# Patient Record
Sex: Female | Born: 1960 | Race: White | Hispanic: No
Health system: Midwestern US, Academic
[De-identification: ages and names within clinical notes are randomized; demographics above are authoritative.]

---

## 2017-12-03 ENCOUNTER — Encounter: Admit: 2017-12-03 | Discharge: 2017-12-03 | Payer: MEDICARE

## 2017-12-03 ENCOUNTER — Encounter: Admit: 2017-12-03 | Discharge: 2017-12-04 | Payer: MEDICARE

## 2017-12-03 DIAGNOSIS — D6861 Antiphospholipid syndrome: ICD-10-CM

## 2017-12-03 DIAGNOSIS — S065X9A Traumatic subdural hemorrhage with loss of consciousness of unspecified duration, initial encounter: ICD-10-CM

## 2017-12-03 DIAGNOSIS — M8000XA Age-related osteoporosis with current pathological fracture, unspecified site, initial encounter for fracture: ICD-10-CM

## 2017-12-03 MED ORDER — ALBUTEROL SULFATE 2.5 MG/0.5 ML IN NEBU
2.5 mg | Freq: Four times a day (QID) | RESPIRATORY_TRACT | 0 refills | Status: DC | PRN
Start: 2017-12-03 — End: 2017-12-05
  Administered 2017-12-04 – 2017-12-05 (×7): 2.5 mg via RESPIRATORY_TRACT

## 2017-12-03 MED ORDER — LEVETIRACETAM IN NACL (ISO-OS) 500 MG/100 ML IV PGBK
500 mg | Freq: Two times a day (BID) | INTRAVENOUS | 0 refills | Status: DC
Start: 2017-12-03 — End: 2017-12-04
  Administered 2017-12-04: 04:00:00 500 mg via INTRAVENOUS

## 2017-12-03 MED ORDER — BUDESONIDE-FORMOTEROL 160-4.5 MCG/ACTUATION IN HFAA
2 | Freq: Two times a day (BID) | RESPIRATORY_TRACT | 0 refills | Status: DC
Start: 2017-12-03 — End: 2017-12-04
  Administered 2017-12-04: 15:00:00 1 via RESPIRATORY_TRACT

## 2017-12-03 MED ORDER — LABETALOL 5 MG/ML IV SYRG
10 mg | INTRAVENOUS | 0 refills | Status: DC | PRN
Start: 2017-12-03 — End: 2017-12-04

## 2017-12-03 MED ORDER — SODIUM CHLORIDE 0.9 % IJ SOLN
50 mL | Freq: Once | INTRAVENOUS | 0 refills | Status: CP
Start: 2017-12-03 — End: ?
  Administered 2017-12-04: 04:00:00 50 mL via INTRAVENOUS

## 2017-12-03 MED ORDER — FENTANYL CITRATE (PF) 50 MCG/ML IJ SOLN
25-50 ug | INTRAVENOUS | 0 refills | Status: DC | PRN
Start: 2017-12-03 — End: 2017-12-04
  Administered 2017-12-04: 04:00:00 25 ug via INTRAVENOUS
  Administered 2017-12-04 (×2): 50 ug via INTRAVENOUS

## 2017-12-03 MED ORDER — IOHEXOL 350 MG IODINE/ML IV SOLN
80 mL | Freq: Once | INTRAVENOUS | 0 refills | Status: CP
Start: 2017-12-03 — End: ?
  Administered 2017-12-04: 04:00:00 80 mL via INTRAVENOUS

## 2017-12-03 MED ORDER — ONDANSETRON HCL (PF) 4 MG/2 ML IJ SOLN
4 mg | INTRAVENOUS | 0 refills | Status: DC | PRN
Start: 2017-12-03 — End: 2017-12-11

## 2017-12-03 MED ORDER — SODIUM CHLORIDE 0.9 % IV SOLP
1000 mL | INTRAVENOUS | 0 refills | Status: CP
Start: 2017-12-03 — End: ?
  Administered 2017-12-04: 04:00:00 1000 mL via INTRAVENOUS

## 2017-12-03 MED ORDER — IPRATROPIUM BROMIDE 0.02 % IN SOLN
0.5 mg | Freq: Four times a day (QID) | RESPIRATORY_TRACT | 0 refills | Status: DC | PRN
Start: 2017-12-03 — End: 2017-12-05
  Administered 2017-12-04 – 2017-12-05 (×7): 0.5 mg via RESPIRATORY_TRACT

## 2017-12-04 ENCOUNTER — Encounter: Admit: 2017-12-04 | Discharge: 2017-12-04 | Payer: MEDICARE

## 2017-12-04 ENCOUNTER — Inpatient Hospital Stay: Admit: 2017-12-04 | Discharge: 2017-12-04 | Payer: MEDICARE

## 2017-12-04 ENCOUNTER — Ambulatory Visit: Admit: 2017-12-04 | Discharge: 2017-12-04 | Payer: MEDICARE

## 2017-12-04 DIAGNOSIS — F419 Anxiety disorder, unspecified: ICD-10-CM

## 2017-12-04 DIAGNOSIS — D6859 Other primary thrombophilia: Principal | ICD-10-CM

## 2017-12-04 DIAGNOSIS — J449 Chronic obstructive pulmonary disease, unspecified: ICD-10-CM

## 2017-12-04 DIAGNOSIS — M8580 Other specified disorders of bone density and structure, unspecified site: ICD-10-CM

## 2017-12-04 DIAGNOSIS — E785 Hyperlipidemia, unspecified: ICD-10-CM

## 2017-12-04 DIAGNOSIS — S065X0A Traumatic subdural hemorrhage without loss of consciousness, initial encounter: Principal | ICD-10-CM

## 2017-12-04 DIAGNOSIS — M329 Systemic lupus erythematosus, unspecified: ICD-10-CM

## 2017-12-04 DIAGNOSIS — M797 Fibromyalgia: ICD-10-CM

## 2017-12-04 DIAGNOSIS — F329 Major depressive disorder, single episode, unspecified: ICD-10-CM

## 2017-12-04 DIAGNOSIS — D6861 Antiphospholipid syndrome: ICD-10-CM

## 2017-12-04 DIAGNOSIS — S22000A Wedge compression fracture of unspecified thoracic vertebra, initial encounter for closed fracture: ICD-10-CM

## 2017-12-04 LAB — BASIC METABOLIC PANEL
Lab: 121 MMOL/L — ABNORMAL LOW (ref 137–147)
Lab: 126 MMOL/L — ABNORMAL LOW (ref <150)

## 2017-12-04 LAB — CBC
Lab: 10 FL (ref >60)
Lab: 14 % — ABNORMAL LOW (ref 11–15)
Lab: 175 K/UL (ref >60)
Lab: 34 g/dL — ABNORMAL LOW (ref 32.0–36.0)
Lab: 5 K/UL (ref 4.5–11.0)
Lab: 3 K/UL — ABNORMAL LOW (ref <100)

## 2017-12-04 LAB — MANUAL DIFF
Lab: 12 % — ABNORMAL HIGH (ref 0–10)
Lab: 25 % (ref 24–44)
Lab: 5 % (ref 4–12)
Lab: 58 % (ref 41–77)

## 2017-12-04 LAB — LACTIC ACID(LACTATE): Lab: 0.4 MMOL/L — ABNORMAL LOW (ref 0.5–2.0)

## 2017-12-04 LAB — PHOSPHORUS
Lab: 3 mg/dL — ABNORMAL LOW (ref 2.0–4.5)
Lab: 3.3 mg/dL — ABNORMAL LOW (ref >60)

## 2017-12-04 LAB — MAGNESIUM
Lab: 1.7 mg/dL — ABNORMAL LOW (ref 1.6–2.6)
Lab: 1.8 mg/dL — ABNORMAL LOW (ref 1.6–2.6)

## 2017-12-04 LAB — PROTIME INR (PT)
Lab: 1.5 pg — ABNORMAL HIGH (ref 0.8–1.2)
Lab: 1.4 FL — ABNORMAL HIGH (ref >60)

## 2017-12-04 LAB — PARATHYROID HORMONE: Lab: 22 pg/mL (ref 10–65)

## 2017-12-04 LAB — IONIZED CALCIUM: Lab: 1 MMOL/L (ref 1.0–1.3)

## 2017-12-04 LAB — 25-OH VITAMIN D (D2 + D3): Lab: 28 ng/mL — ABNORMAL LOW (ref 30–80)

## 2017-12-04 LAB — TSH WITH FREE T4 REFLEX: Lab: 0.6 uU/mL (ref 0.35–5.00)

## 2017-12-04 MED ORDER — DOXYCYCLINE HYCLATE 100 MG PO TAB
100 mg | Freq: Two times a day (BID) | ORAL | 0 refills | Status: DC
Start: 2017-12-04 — End: 2017-12-04
  Administered 2017-12-04 (×2): 100 mg via ORAL

## 2017-12-04 MED ORDER — FLUOXETINE 20 MG PO CAP
40 mg | Freq: Every day | ORAL | 0 refills | Status: DC
Start: 2017-12-04 — End: 2017-12-11
  Administered 2017-12-04 – 2017-12-10 (×7): 40 mg via ORAL

## 2017-12-04 MED ORDER — DIAZEPAM 10 MG PO TAB
10 mg | Freq: Three times a day (TID) | ORAL | 0 refills | Status: DC | PRN
Start: 2017-12-04 — End: 2017-12-11
  Administered 2017-12-04 – 2017-12-08 (×7): 10 mg via ORAL

## 2017-12-04 MED ORDER — HYDROXYCHLOROQUINE 200 MG PO TAB
100 mg | Freq: Every day | ORAL | 0 refills | Status: DC
Start: 2017-12-04 — End: 2017-12-11
  Administered 2017-12-05 – 2017-12-10 (×6): 100 mg via ORAL

## 2017-12-04 MED ORDER — SENNOSIDES 8.6 MG PO TAB
1 | Freq: Two times a day (BID) | ORAL | 0 refills | Status: DC
Start: 2017-12-04 — End: 2017-12-11
  Administered 2017-12-04 – 2017-12-10 (×8): 1 via ORAL

## 2017-12-04 MED ORDER — ATORVASTATIN 20 MG PO TAB
20 mg | Freq: Every day | ORAL | 0 refills | Status: DC
Start: 2017-12-04 — End: 2017-12-11
  Administered 2017-12-04 – 2017-12-10 (×7): 20 mg via ORAL

## 2017-12-04 MED ORDER — HYDROXYCHLOROQUINE 200 MG PO TAB
200 mg | Freq: Every day | ORAL | 0 refills | Status: DC
Start: 2017-12-04 — End: 2017-12-04

## 2017-12-04 MED ORDER — PANTOPRAZOLE(#) 2MG/ML PO SUSP
40 mg | Freq: Every day | ORAL | 0 refills | Status: DC
Start: 2017-12-04 — End: 2017-12-05
  Administered 2017-12-05: 03:00:00 40 mg via ORAL

## 2017-12-04 MED ORDER — LEVETIRACETAM 500 MG PO TAB
500 mg | Freq: Two times a day (BID) | ORAL | 0 refills | Status: DC
Start: 2017-12-04 — End: 2017-12-11
  Administered 2017-12-04 – 2017-12-10 (×13): 500 mg via ORAL

## 2017-12-04 MED ORDER — PANTOPRAZOLE 40 MG PO TBEC
40 mg | Freq: Every day | ORAL | 0 refills | Status: DC
Start: 2017-12-04 — End: 2017-12-11
  Administered 2017-12-06 – 2017-12-10 (×5): 40 mg via ORAL

## 2017-12-04 MED ORDER — BUDESONIDE-FORMOTEROL 160-4.5 MCG/ACTUATION IN HFAA
1 | Freq: Two times a day (BID) | RESPIRATORY_TRACT | 0 refills | Status: DC
Start: 2017-12-04 — End: 2017-12-11
  Administered 2017-12-07: 04:00:00 1 via RESPIRATORY_TRACT

## 2017-12-04 MED ORDER — OXYCODONE 5 MG PO TAB
5-15 mg | ORAL | 0 refills | Status: DC | PRN
Start: 2017-12-04 — End: 2017-12-04

## 2017-12-04 MED ORDER — HYDROMORPHONE (PF) 2 MG/ML IJ SYRG
.5-1 mg | INTRAVENOUS | 0 refills | Status: DC | PRN
Start: 2017-12-04 — End: 2017-12-04
  Administered 2017-12-04 (×4): 0.5 mg via INTRAVENOUS

## 2017-12-04 MED ORDER — HYDROXYCHLOROQUINE 200 MG PO TAB
200 mg | Freq: Every day | ORAL | 0 refills | Status: DC
Start: 2017-12-04 — End: 2017-12-11
  Administered 2017-12-04 – 2017-12-09 (×6): 200 mg via ORAL

## 2017-12-04 MED ORDER — CALCIUM CARBONATE-VITAMIN D3 500 MG(1,250MG) -200 UNIT PO TAB
1 | Freq: Two times a day (BID) | ORAL | 0 refills | Status: DC
Start: 2017-12-04 — End: 2017-12-05
  Administered 2017-12-04 – 2017-12-05 (×3): 1 via ORAL

## 2017-12-04 MED ORDER — MAGNESIUM SULFATE IN D5W 1 GRAM/100 ML IV PGBK
1 g | INTRAVENOUS | 0 refills | Status: CP
Start: 2017-12-04 — End: ?
  Administered 2017-12-04 (×2): 1 g via INTRAVENOUS

## 2017-12-04 MED ORDER — MIRTAZAPINE 7.5 MG PO TAB
7.5 mg | Freq: Every evening | ORAL | 0 refills | Status: DC
Start: 2017-12-04 — End: 2017-12-05
  Administered 2017-12-05: 03:00:00 7.5 mg via ORAL

## 2017-12-04 MED ORDER — METOCLOPRAMIDE HCL 10 MG PO TAB
5 mg | Freq: Before meals | ORAL | 0 refills | Status: DC
Start: 2017-12-04 — End: 2017-12-11
  Administered 2017-12-04 – 2017-12-10 (×23): 5 mg via ORAL

## 2017-12-04 MED ORDER — OXYCODONE 5 MG PO TAB
5-10 mg | ORAL | 0 refills | Status: DC | PRN
Start: 2017-12-04 — End: 2017-12-11
  Administered 2017-12-04 – 2017-12-06 (×12): 10 mg via ORAL
  Administered 2017-12-07: 23:00:00 5 mg via ORAL
  Administered 2017-12-07 – 2017-12-10 (×12): 10 mg via ORAL

## 2017-12-04 MED ORDER — HYDROXYCHLOROQUINE 200 MG PO TAB
200 mg | Freq: Every day | ORAL | 0 refills | Status: DC
Start: 2017-12-04 — End: 2017-12-04
  Administered 2017-12-04: 19:00:00 200 mg via ORAL

## 2017-12-04 MED ORDER — ALPRAZOLAM 1 MG PO TAB
1 mg | Freq: Every evening | ORAL | 0 refills | Status: DC | PRN
Start: 2017-12-04 — End: 2017-12-11
  Administered 2017-12-05 – 2017-12-09 (×5): 1 mg via ORAL

## 2017-12-04 MED ORDER — GABAPENTIN 400 MG PO CAP
400 mg | ORAL | 0 refills | Status: DC
Start: 2017-12-04 — End: 2017-12-04
  Administered 2017-12-04: 19:00:00 400 mg via ORAL

## 2017-12-04 MED ORDER — ACETAMINOPHEN 325 MG PO TAB
650 mg | ORAL | 0 refills | Status: DC | PRN
Start: 2017-12-04 — End: 2017-12-11
  Administered 2017-12-04 – 2017-12-10 (×12): 650 mg via ORAL

## 2017-12-04 MED ORDER — CYCLOBENZAPRINE 10 MG PO TAB
10 mg | Freq: Three times a day (TID) | ORAL | 0 refills | Status: DC
Start: 2017-12-04 — End: 2017-12-11
  Administered 2017-12-04 – 2017-12-10 (×17): 10 mg via ORAL

## 2017-12-05 LAB — ALK PHOS TOTAL: Lab: 124 U/L — ABNORMAL HIGH (ref 25–110)

## 2017-12-05 LAB — SODIUM-URINE RANDOM: Lab: 51 MMOL/L

## 2017-12-05 LAB — ALBUMIN: Lab: 3.6 g/dL (ref 3.5–5.0)

## 2017-12-05 LAB — CELIAC SCREEN

## 2017-12-05 LAB — BASIC METABOLIC PANEL: Lab: 129 MMOL/L — ABNORMAL LOW (ref 137–147)

## 2017-12-05 LAB — PHOSPHORUS: Lab: 3.9 mg/dL — ABNORMAL LOW (ref >60)

## 2017-12-05 LAB — CBC: Lab: 3.7 K/UL — ABNORMAL LOW (ref >60)

## 2017-12-05 LAB — BETA HYDROXYBUTYRATE (KETONES): Lab: 3.8 MMOL/L — ABNORMAL HIGH (ref <0.3)

## 2017-12-05 LAB — MAGNESIUM: Lab: 1.9 mg/dL — ABNORMAL LOW (ref >60)

## 2017-12-05 LAB — PROTIME INR (PT): Lab: 1.6 MMOL/L — ABNORMAL HIGH (ref 0.8–1.2)

## 2017-12-05 LAB — CORTISOL-AM: Lab: 24 ug/dL — ABNORMAL HIGH (ref 6.7–22.6)

## 2017-12-05 LAB — OSMOLALITY-URINE RANDOM: Lab: 323 mosm/kg (ref 50–1400)

## 2017-12-05 MED ORDER — ALBUTEROL SULFATE 2.5 MG/0.5 ML IN NEBU
2.5 mg | Freq: Every morning | RESPIRATORY_TRACT | 0 refills | Status: DC
Start: 2017-12-05 — End: 2017-12-11
  Administered 2017-12-07 – 2017-12-10 (×5): 2.5 mg via RESPIRATORY_TRACT

## 2017-12-05 MED ORDER — ALBUTEROL SULFATE 90 MCG/ACTUATION IN HFAA
2 | Freq: Three times a day (TID) | RESPIRATORY_TRACT | 0 refills | Status: DC | PRN
Start: 2017-12-05 — End: 2017-12-11
  Administered 2017-12-06 – 2017-12-07 (×3): 2 via RESPIRATORY_TRACT

## 2017-12-05 MED ORDER — HEPARIN, PORCINE (PF) 5,000 UNIT/0.5 ML IJ SYRG
5000 [IU] | SUBCUTANEOUS | 0 refills | Status: DC
Start: 2017-12-05 — End: 2017-12-06
  Administered 2017-12-06 (×2): 5000 [IU] via SUBCUTANEOUS

## 2017-12-05 MED ORDER — MAGNESIUM OXIDE 400 MG (241.3 MG MAGNESIUM) PO TAB
400 mg | Freq: Once | ORAL | 0 refills | Status: CP
Start: 2017-12-05 — End: ?
  Administered 2017-12-05: 14:00:00 400 mg via ORAL

## 2017-12-05 MED ORDER — IPRATROPIUM BROMIDE 0.02 % IN SOLN
0.5 mg | Freq: Every morning | RESPIRATORY_TRACT | 0 refills | Status: DC
Start: 2017-12-05 — End: 2017-12-11
  Administered 2017-12-06 – 2017-12-10 (×5): 0.5 mg via RESPIRATORY_TRACT

## 2017-12-05 MED ORDER — MIRTAZAPINE 15 MG PO TAB
15 mg | Freq: Every evening | ORAL | 0 refills | Status: DC
Start: 2017-12-05 — End: 2017-12-11
  Administered 2017-12-06 – 2017-12-10 (×5): 15 mg via ORAL

## 2017-12-05 MED ORDER — POTASSIUM CHLORIDE 20 MEQ PO TBTQ
20 meq | Freq: Once | ORAL | 0 refills | Status: CP
Start: 2017-12-05 — End: ?
  Administered 2017-12-05: 14:00:00 20 meq via ORAL

## 2017-12-05 MED ORDER — CHOLECALCIFEROL (VITAMIN D3) 1,000 UNIT PO TAB
2000 [IU] | Freq: Every day | ORAL | 0 refills | Status: DC
Start: 2017-12-05 — End: 2017-12-11
  Administered 2017-12-05 – 2017-12-10 (×5): 2000 [IU] via ORAL

## 2017-12-05 MED ORDER — CALCIUM CITRATE 200 MG (950 MG) PO TAB
1900 mg | Freq: Three times a day (TID) | ORAL | 0 refills | Status: DC
Start: 2017-12-05 — End: 2017-12-11
  Administered 2017-12-05 – 2017-12-10 (×14): 1900 mg via ORAL

## 2017-12-05 MED ORDER — GABAPENTIN 400 MG PO CAP
400 mg | Freq: Two times a day (BID) | ORAL | 0 refills | Status: DC
Start: 2017-12-05 — End: 2017-12-11
  Administered 2017-12-05 – 2017-12-10 (×7): 400 mg via ORAL

## 2017-12-06 ENCOUNTER — Ambulatory Visit: Admit: 2017-12-06 | Discharge: 2017-12-07 | Payer: MEDICARE

## 2017-12-06 DIAGNOSIS — Z5181 Encounter for therapeutic drug level monitoring: ICD-10-CM

## 2017-12-06 DIAGNOSIS — M810 Age-related osteoporosis without current pathological fracture: Principal | ICD-10-CM

## 2017-12-06 LAB — URINE COLLECTION
Lab: 179 mL
Lab: 179 mL — ABNORMAL HIGH (ref 11–15)
Lab: 24 g/dL (ref 32.0–36.0)
Lab: 24

## 2017-12-06 LAB — BASIC METABOLIC PANEL: Lab: 131 MMOL/L — ABNORMAL LOW (ref >60)

## 2017-12-06 LAB — PHOSPHORUS: Lab: 3 mg/dL — ABNORMAL LOW (ref >60)

## 2017-12-06 LAB — CBC: Lab: 3.4 K/UL — ABNORMAL LOW (ref 4.5–11.0)

## 2017-12-06 LAB — CALCIUM-URINE 24HR: Lab: 5.9 mg/dL (ref 40–220)

## 2017-12-06 LAB — PROTIME INR (PT): Lab: 1.4 MMOL/L — ABNORMAL HIGH (ref 0.8–1.2)

## 2017-12-06 LAB — SODIUM-URINE 24 HR: Lab: 27 MMOL/L

## 2017-12-06 LAB — MAGNESIUM: Lab: 1.9 mg/dL — ABNORMAL HIGH (ref 1.6–2.6)

## 2017-12-06 LAB — CREATININE-URINE 24 HR: Lab: 22 mg/dL (ref 100–300)

## 2017-12-06 MED ORDER — POTASSIUM CHLORIDE 20 MEQ PO TBTQ
20 meq | Freq: Once | ORAL | 0 refills | Status: CP
Start: 2017-12-06 — End: ?
  Administered 2017-12-06: 12:00:00 20 meq via ORAL

## 2017-12-06 MED ORDER — WARFARIN 5 MG PO TAB
5 mg | ORAL | 0 refills | Status: DC
Start: 2017-12-06 — End: 2017-12-06

## 2017-12-06 MED ORDER — HEPARIN, PORCINE (PF) 5,000 UNIT/0.5 ML IJ SYRG
5000 [IU] | SUBCUTANEOUS | 0 refills | Status: DC
Start: 2017-12-06 — End: 2017-12-11
  Administered 2017-12-06 – 2017-12-10 (×10): 5000 [IU] via SUBCUTANEOUS

## 2017-12-06 MED ORDER — WARFARIN PHARMACY TO MANAGE
1 | 0 refills | Status: DC
Start: 2017-12-06 — End: 2017-12-06

## 2017-12-06 MED ORDER — WARFARIN 7.5 MG PO TAB
7.5 mg | ORAL | 0 refills | Status: DC
Start: 2017-12-06 — End: 2017-12-06

## 2017-12-06 MED ORDER — ENOXAPARIN 60 MG/0.6 ML SC SYRG
1 mg/kg | Freq: Two times a day (BID) | SUBCUTANEOUS | 0 refills | Status: DC
Start: 2017-12-06 — End: 2017-12-06

## 2017-12-07 LAB — MANUAL DIFF
Lab: 1 % (ref 0–10)
Lab: 14 % — ABNORMAL LOW (ref 24–44)
Lab: 7 % (ref 4–12)
Lab: 78 % — ABNORMAL HIGH (ref 41–77)

## 2017-12-07 LAB — CBC
Lab: 10 FL (ref >60)
Lab: 14 % (ref 11–15)
Lab: 212 K/UL (ref >60)
Lab: 28 pg — ABNORMAL LOW (ref <=2)
Lab: 3.1 K/UL — ABNORMAL LOW (ref 4.5–11.0)
Lab: 33 g/dL — ABNORMAL LOW (ref <=5)
Lab: 36 % (ref 36–45)
Lab: 85 FL — ABNORMAL HIGH (ref <=5)

## 2017-12-07 LAB — MAGNESIUM: Lab: 1.8 mg/dL — ABNORMAL LOW (ref 1.6–2.6)

## 2017-12-07 LAB — PHOSPHORUS: Lab: 4 mg/dL (ref 2.0–4.5)

## 2017-12-07 LAB — PROTIME INR (PT): Lab: 1.1 K/UL (ref 0.8–1.2)

## 2017-12-07 LAB — BASIC METABOLIC PANEL: Lab: 133 MMOL/L — ABNORMAL LOW (ref >=60)

## 2017-12-07 MED ORDER — MAGNESIUM SULFATE IN D5W 1 GRAM/100 ML IV PGBK
1 g | INTRAVENOUS | 0 refills | Status: CP
Start: 2017-12-07 — End: ?
  Administered 2017-12-07 (×2): 1 g via INTRAVENOUS

## 2017-12-07 MED ORDER — NICOTINE 14 MG/24 HR TD PT24
1 | Freq: Every day | TRANSDERMAL | 0 refills | Status: DC
Start: 2017-12-07 — End: 2017-12-11
  Administered 2017-12-07 – 2017-12-10 (×3): 1 via TRANSDERMAL

## 2017-12-08 ENCOUNTER — Encounter: Admit: 2017-12-08 | Discharge: 2017-12-08 | Payer: MEDICARE

## 2017-12-08 DIAGNOSIS — M8580 Other specified disorders of bone density and structure, unspecified site: ICD-10-CM

## 2017-12-08 DIAGNOSIS — J449 Chronic obstructive pulmonary disease, unspecified: ICD-10-CM

## 2017-12-08 DIAGNOSIS — S22000A Wedge compression fracture of unspecified thoracic vertebra, initial encounter for closed fracture: ICD-10-CM

## 2017-12-08 DIAGNOSIS — M797 Fibromyalgia: ICD-10-CM

## 2017-12-08 DIAGNOSIS — F329 Major depressive disorder, single episode, unspecified: ICD-10-CM

## 2017-12-08 DIAGNOSIS — E785 Hyperlipidemia, unspecified: ICD-10-CM

## 2017-12-08 DIAGNOSIS — D6859 Other primary thrombophilia: Principal | ICD-10-CM

## 2017-12-08 DIAGNOSIS — D6861 Antiphospholipid syndrome: ICD-10-CM

## 2017-12-08 DIAGNOSIS — F419 Anxiety disorder, unspecified: ICD-10-CM

## 2017-12-08 DIAGNOSIS — M329 Systemic lupus erythematosus, unspecified: ICD-10-CM

## 2017-12-08 LAB — PROTIME INR (PT): Lab: 1 M/UL — ABNORMAL HIGH (ref >60)

## 2017-12-08 LAB — CBC: Lab: 5.1 K/UL — ABNORMAL LOW (ref >60)

## 2017-12-08 LAB — MAGNESIUM: Lab: 2.1 mg/dL — ABNORMAL LOW (ref 1.6–2.6)

## 2017-12-08 LAB — BASIC METABOLIC PANEL: Lab: 130 MMOL/L — ABNORMAL LOW (ref >60)

## 2017-12-08 LAB — PHOSPHORUS: Lab: 3.9 mg/dL — ABNORMAL HIGH (ref 2.0–4.5)

## 2017-12-09 ENCOUNTER — Ambulatory Visit: Admit: 2017-12-09 | Discharge: 2017-12-09 | Payer: MEDICARE

## 2017-12-09 ENCOUNTER — Inpatient Hospital Stay: Admit: 2017-12-09 | Discharge: 2017-12-09 | Payer: MEDICARE

## 2017-12-09 DIAGNOSIS — S065X0A Traumatic subdural hemorrhage without loss of consciousness, initial encounter: Principal | ICD-10-CM

## 2017-12-09 LAB — MUCOPOLYSACCHRIDE  SCN, UR: Lab: 9.3 — ABNORMAL HIGH

## 2017-12-09 MED ORDER — ROCURONIUM 10 MG/ML IV SOLN
INTRAVENOUS | 0 refills | Status: DC
Start: 2017-12-09 — End: 2017-12-09
  Administered 2017-12-09: 17:00:00 30 mg via INTRAVENOUS

## 2017-12-09 MED ORDER — CEFAZOLIN 1 GRAM IJ SOLR
0 refills | Status: DC
Start: 2017-12-09 — End: 2017-12-09
  Administered 2017-12-09: 17:00:00 1 g via INTRAVENOUS

## 2017-12-09 MED ORDER — SODIUM CHLORIDE 0.9 % IV SOLP
0 refills | Status: DC
Start: 2017-12-09 — End: 2017-12-09
  Administered 2017-12-09: 16:00:00 via INTRAVENOUS

## 2017-12-09 MED ORDER — PROPOFOL INJ 10 MG/ML IV VIAL
0 refills | Status: DC
Start: 2017-12-09 — End: 2017-12-09
  Administered 2017-12-09: 17:00:00 20 mg via INTRAVENOUS

## 2017-12-09 MED ORDER — LIDOCAINE (PF) 200 MG/10 ML (2 %) IJ SYRG
0 refills | Status: DC
Start: 2017-12-09 — End: 2017-12-09
  Administered 2017-12-09: 17:00:00 60 mg via INTRAVENOUS

## 2017-12-09 MED ORDER — FENTANYL CITRATE (PF) 50 MCG/ML IJ SOLN
0 refills | Status: DC
Start: 2017-12-09 — End: 2017-12-09
  Administered 2017-12-09: 18:00:00 25 ug via INTRAVENOUS
  Administered 2017-12-09: 17:00:00 50 ug via INTRAVENOUS

## 2017-12-09 MED ORDER — FENTANYL CITRATE (PF) 50 MCG/ML IJ SOLN
12.5 ug | INTRAVENOUS | 0 refills | Status: DC | PRN
Start: 2017-12-09 — End: 2017-12-09

## 2017-12-09 MED ORDER — HALOPERIDOL LACTATE 5 MG/ML IJ SOLN
.5 mg | Freq: Once | INTRAVENOUS | 0 refills | Status: DC | PRN
Start: 2017-12-09 — End: 2017-12-09

## 2017-12-09 MED ORDER — ONDANSETRON HCL (PF) 4 MG/2 ML IJ SOLN
INTRAVENOUS | 0 refills | Status: DC
Start: 2017-12-09 — End: 2017-12-09
  Administered 2017-12-09: 17:00:00 4 mg via INTRAVENOUS

## 2017-12-09 MED ORDER — DEXTRAN 70-HYPROMELLOSE (PF) 0.1-0.3 % OP DPET
0 refills | Status: DC
Start: 2017-12-09 — End: 2017-12-09
  Administered 2017-12-09: 17:00:00 2 [drp] via OPHTHALMIC

## 2017-12-09 MED ORDER — DIPHENHYDRAMINE HCL 50 MG/ML IJ SOLN
12.5 mg | Freq: Once | INTRAVENOUS | 0 refills | Status: DC | PRN
Start: 2017-12-09 — End: 2017-12-09

## 2017-12-09 MED ORDER — FENTANYL CITRATE (PF) 50 MCG/ML IJ SOLN
25 ug | INTRAVENOUS | 0 refills | Status: DC | PRN
Start: 2017-12-09 — End: 2017-12-09

## 2017-12-09 MED ORDER — SUGAMMADEX 100 MG/ML IV SOLN
INTRAVENOUS | 0 refills | Status: DC
Start: 2017-12-09 — End: 2017-12-09
  Administered 2017-12-09: 18:00:00 100 mg via INTRAVENOUS

## 2017-12-09 MED ORDER — DEXAMETHASONE SODIUM PHOSPHATE 4 MG/ML IJ SOLN
INTRAVENOUS | 0 refills | Status: DC
Start: 2017-12-09 — End: 2017-12-09
  Administered 2017-12-09: 17:00:00 4 mg via INTRAVENOUS

## 2017-12-09 MED ORDER — PHENYLEPHRINE IN 0.9% NACL(PF) 1 MG/10 ML (100 MCG/ML) IV SYRG
INTRAVENOUS | 0 refills | Status: DC
Start: 2017-12-09 — End: 2017-12-09
  Administered 2017-12-09: 17:00:00 50 ug via INTRAVENOUS
  Administered 2017-12-09: 17:00:00 100 ug via INTRAVENOUS

## 2017-12-09 MED ADMIN — FENTANYL CITRATE (PF) 50 MCG/ML IJ SOLN [3037]: 12.5 ug | INTRAVENOUS | @ 18:00:00 | Stop: 2017-12-09 | NDC 00409909412

## 2017-12-10 ENCOUNTER — Inpatient Hospital Stay: Admit: 2017-12-04 | Discharge: 2017-12-04 | Payer: MEDICARE

## 2017-12-10 ENCOUNTER — Inpatient Hospital Stay
Admit: 2017-12-04 | Discharge: 2017-12-10 | Disposition: A | Discharge status: Rehabilitation Facility | Payer: MEDICARE | Source: Other Acute Inpatient Hospital | Attending: Critical Care Medicine

## 2017-12-10 ENCOUNTER — Inpatient Hospital Stay: Admit: 2017-12-06 | Discharge: 2017-12-06 | Payer: MEDICARE

## 2017-12-10 ENCOUNTER — Inpatient Hospital Stay: Admit: 2017-12-03 | Discharge: 2017-12-03 | Payer: MEDICARE

## 2017-12-10 ENCOUNTER — Encounter: Admit: 2017-12-10 | Discharge: 2017-12-10 | Payer: MEDICARE

## 2017-12-10 DIAGNOSIS — S22080A Wedge compression fracture of T11-T12 vertebra, initial encounter for closed fracture: ICD-10-CM

## 2017-12-10 DIAGNOSIS — J189 Pneumonia, unspecified organism: ICD-10-CM

## 2017-12-10 DIAGNOSIS — S22060A Wedge compression fracture of T7-T8 vertebra, initial encounter for closed fracture: ICD-10-CM

## 2017-12-10 DIAGNOSIS — E43 Unspecified severe protein-calorie malnutrition: ICD-10-CM

## 2017-12-10 DIAGNOSIS — M810 Age-related osteoporosis without current pathological fracture: ICD-10-CM

## 2017-12-10 DIAGNOSIS — R296 Repeated falls: ICD-10-CM

## 2017-12-10 DIAGNOSIS — Z7901 Long term (current) use of anticoagulants: ICD-10-CM

## 2017-12-10 DIAGNOSIS — M329 Systemic lupus erythematosus, unspecified: ICD-10-CM

## 2017-12-10 DIAGNOSIS — S065X0A Traumatic subdural hemorrhage without loss of consciousness, initial encounter: Principal | ICD-10-CM

## 2017-12-10 DIAGNOSIS — E162 Hypoglycemia, unspecified: ICD-10-CM

## 2017-12-10 DIAGNOSIS — J440 Chronic obstructive pulmonary disease with acute lower respiratory infection: ICD-10-CM

## 2017-12-10 DIAGNOSIS — G8911 Acute pain due to trauma: ICD-10-CM

## 2017-12-10 DIAGNOSIS — R402413 Glasgow coma scale score 13-15, at hospital admission: ICD-10-CM

## 2017-12-10 DIAGNOSIS — S12500A Unspecified displaced fracture of sixth cervical vertebra, initial encounter for closed fracture: ICD-10-CM

## 2017-12-10 DIAGNOSIS — D72819 Decreased white blood cell count, unspecified: ICD-10-CM

## 2017-12-10 DIAGNOSIS — S12600A Unspecified displaced fracture of seventh cervical vertebra, initial encounter for closed fracture: ICD-10-CM

## 2017-12-10 DIAGNOSIS — F17200 Nicotine dependence, unspecified, uncomplicated: ICD-10-CM

## 2017-12-10 DIAGNOSIS — Z8262 Family history of osteoporosis: ICD-10-CM

## 2017-12-10 DIAGNOSIS — E876 Hypokalemia: ICD-10-CM

## 2017-12-10 DIAGNOSIS — S22071A Stable burst fracture of T9-T10 vertebra, initial encounter for closed fracture: ICD-10-CM

## 2017-12-10 DIAGNOSIS — E785 Hyperlipidemia, unspecified: ICD-10-CM

## 2017-12-10 DIAGNOSIS — M40204 Unspecified kyphosis, thoracic region: ICD-10-CM

## 2017-12-10 DIAGNOSIS — E871 Hypo-osmolality and hyponatremia: ICD-10-CM

## 2017-12-10 DIAGNOSIS — D638 Anemia in other chronic diseases classified elsewhere: ICD-10-CM

## 2017-12-10 DIAGNOSIS — D6859 Other primary thrombophilia: ICD-10-CM

## 2017-12-10 DIAGNOSIS — Z7952 Long term (current) use of systemic steroids: ICD-10-CM

## 2017-12-10 LAB — PYRIDOXAL 5 PHOSPHATE

## 2017-12-10 MED ORDER — IPRATROPIUM BROMIDE 0.02 % IN SOLN
1 | Freq: Four times a day (QID) | RESPIRATORY_TRACT | 0 refills | 30.00000 days | Status: AC | PRN
Start: 2017-12-10 — End: ?

## 2017-12-10 MED ORDER — WARFARIN 7.5 MG PO TAB
7.5 mg | ORAL_TABLET | ORAL | 0 refills | 90.00000 days | Status: AC
Start: 2017-12-10 — End: ?

## 2017-12-10 MED ORDER — SENNOSIDES 8.6 MG PO TAB
1 | ORAL_TABLET | Freq: Two times a day (BID) | ORAL | 0 refills | Status: AC
Start: 2017-12-10 — End: ?

## 2017-12-10 MED ORDER — ACETAMINOPHEN 325 MG PO TAB
650 mg | ORAL_TABLET | ORAL | 0 refills | Status: AC | PRN
Start: 2017-12-10 — End: ?

## 2017-12-10 MED ORDER — WARFARIN 5 MG PO TAB
5 mg | ORAL_TABLET | ORAL | 0 refills | 90.00000 days | Status: AC
Start: 2017-12-10 — End: ?

## 2017-12-10 MED ORDER — NICOTINE 14 MG/24 HR TD PT24
1 | MEDICATED_PATCH | Freq: Every day | TRANSDERMAL | 0 refills | Status: AC
Start: 2017-12-10 — End: ?

## 2017-12-10 MED ORDER — CALCIUM CITRATE 200 MG (950 MG) PO TAB
1900 mg | ORAL_TABLET | Freq: Three times a day (TID) | ORAL | 3 refills | 30.00000 days | Status: AC
Start: 2017-12-10 — End: ?

## 2017-12-10 MED ORDER — CHOLECALCIFEROL (VITAMIN D3) 2,000 UNIT PO TAB
2000 [IU] | ORAL_TABLET | Freq: Every day | ORAL | 0 refills | 84.00000 days | Status: AC
Start: 2017-12-10 — End: ?

## 2017-12-10 MED ORDER — LEVETIRACETAM 500 MG PO TAB
500 mg | ORAL_TABLET | Freq: Every day | ORAL | 0 refills | 90.00000 days | Status: AC
Start: 2017-12-10 — End: ?

## 2017-12-25 ENCOUNTER — Encounter: Admit: 2017-12-25 | Discharge: 2017-12-25 | Payer: MEDICARE

## 2017-12-31 ENCOUNTER — Encounter: Admit: 2017-12-31 | Discharge: 2017-12-31 | Payer: MEDICARE

## 2018-01-24 ENCOUNTER — Encounter: Admit: 2018-01-24 | Discharge: 2018-01-24 | Payer: MEDICARE

## 2018-07-25 ENCOUNTER — Encounter: Admit: 2018-07-25 | Discharge: 2018-07-25 | Payer: MEDICARE

## 2018-07-25 MED ORDER — WARFARIN 5 MG PO TAB
ORAL_TABLET | 0 refills
Start: 2018-07-25 — End: ?

## 2020-11-08 IMAGING — CT [facility]
2 series · 15 of 37 positions shown, 18 images · non-contrast
Comparison: none

[Series 502: c-spine ax 2.00 br60 s3 · axial · 0.37mm/px · z∈[-652,-513]mm · 12 of 85 slices shown, 15 images]
[im 6/85  brain]
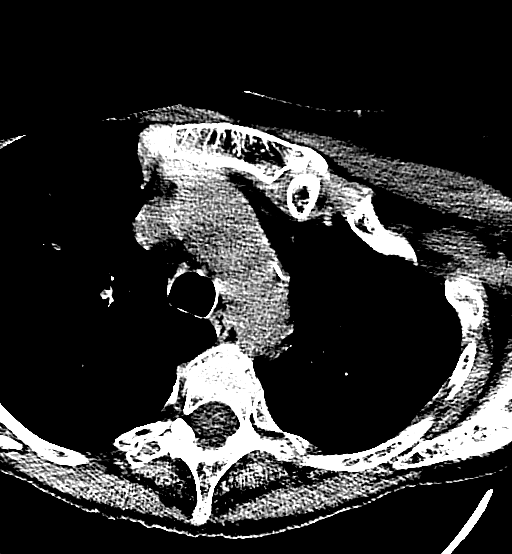
[im 6/85  bone]
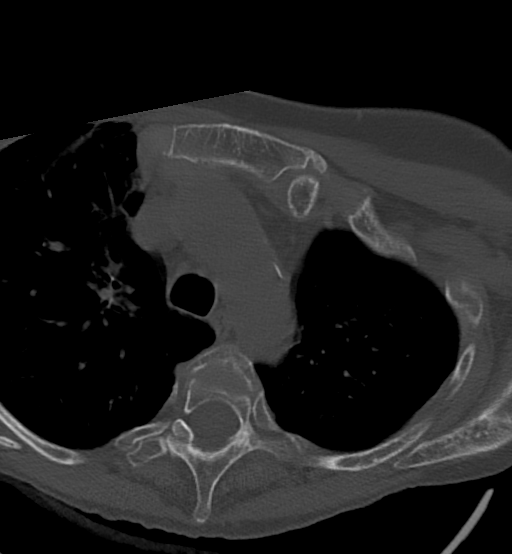
[im 12/85  brain]
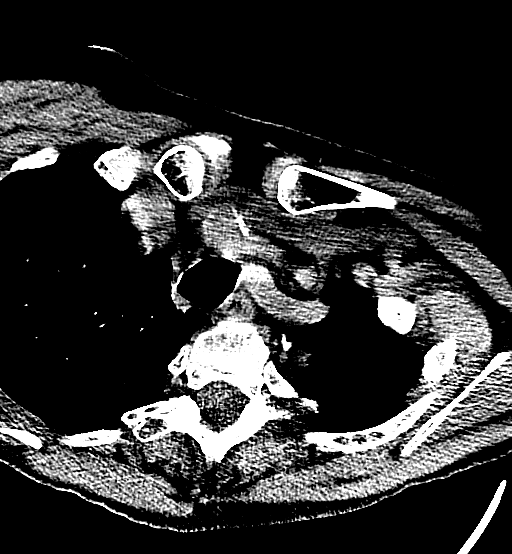
[im 18/85  brain]
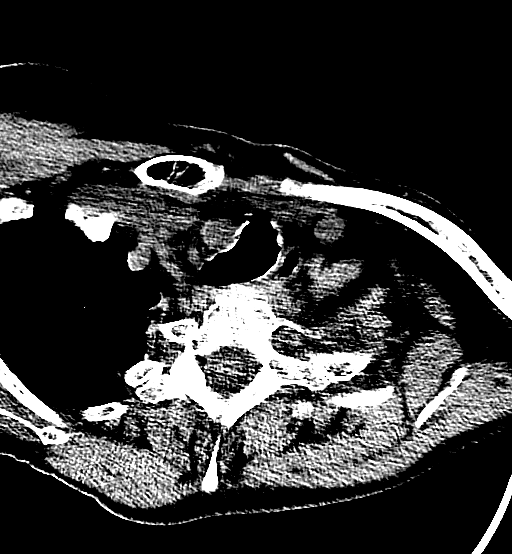
[im 27/85  brain]
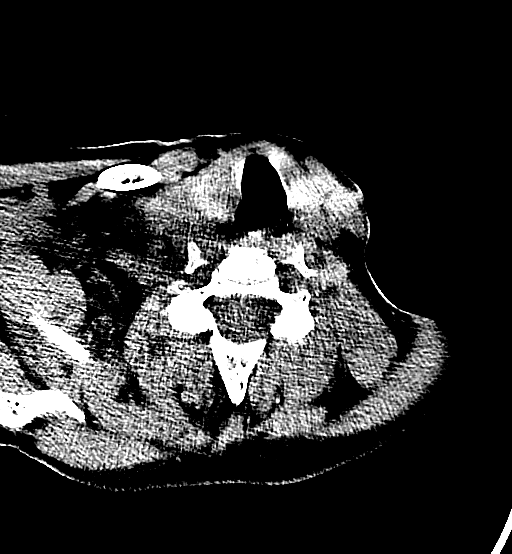
[im 32/85  brain]
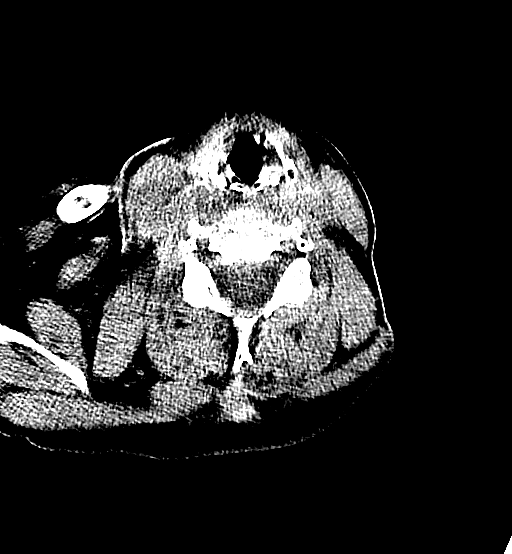
[im 32/85  bone]
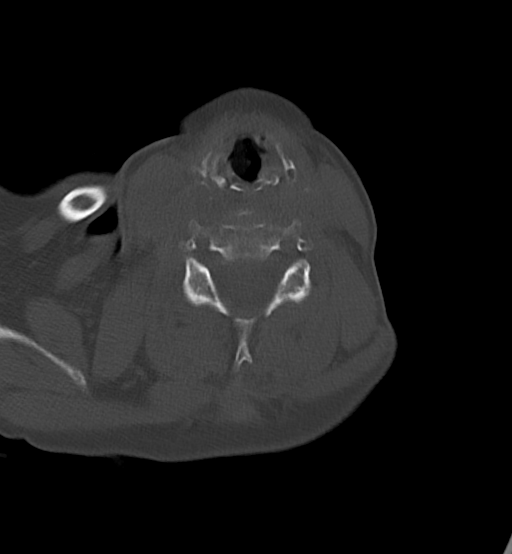
[im 38/85  brain]
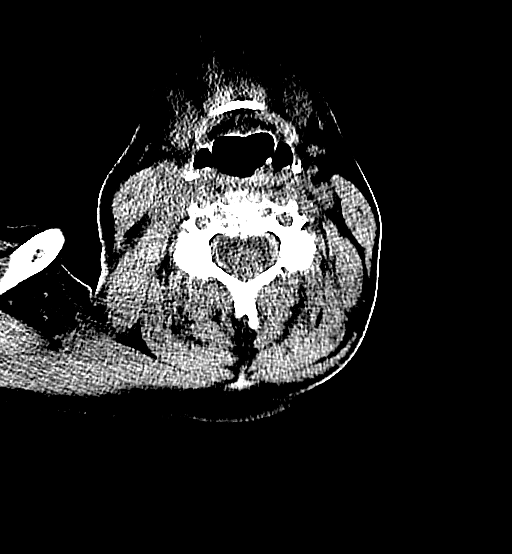
[im 47/85  brain]
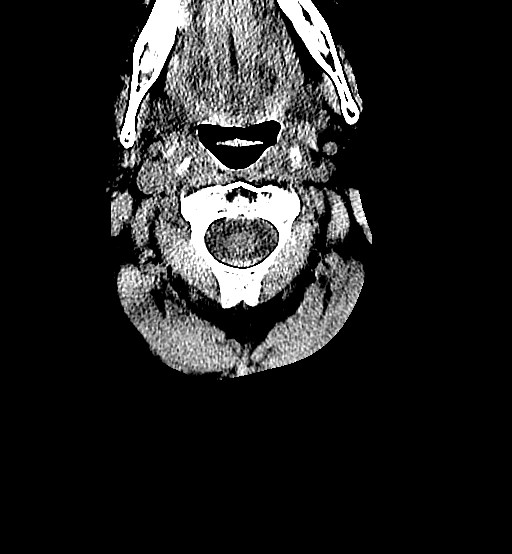
[im 53/85  brain]
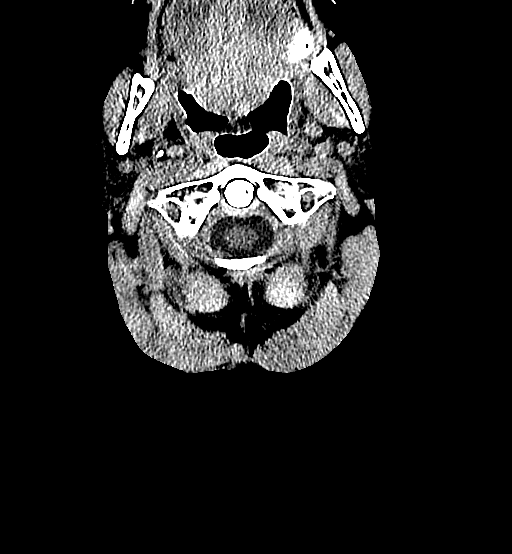
[im 58/85  brain]
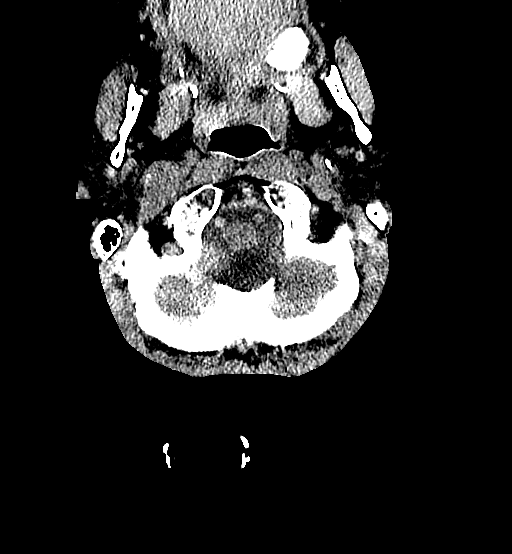
[im 58/85  bone]
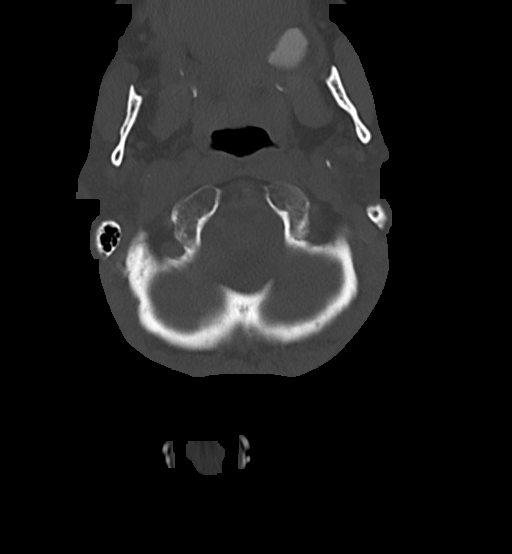
[im 67/85  brain]
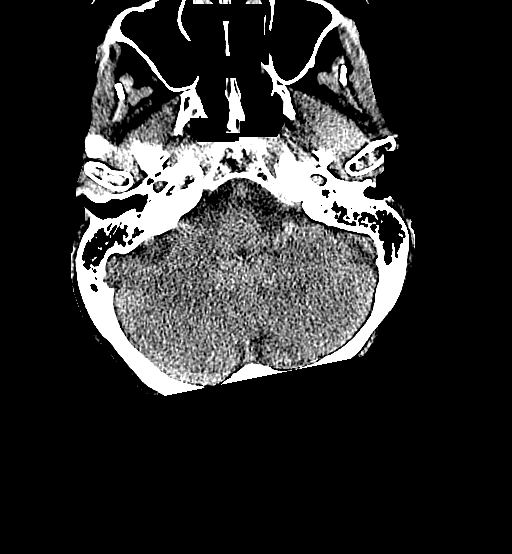
[im 73/85  brain]
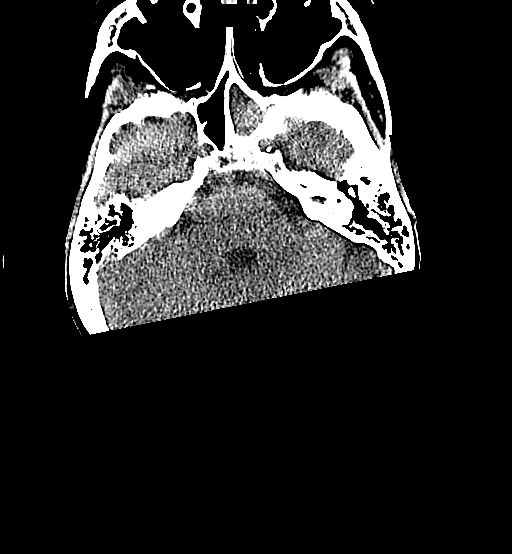
[im 79/85  brain]
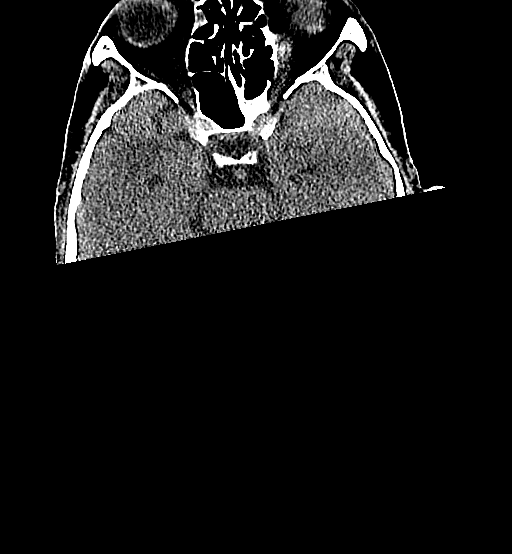

[Series 504: c-spine sag 2.00 br60 s3 · sagittal · 0.40mm/px · 3 of 85 slices shown]
[im 29/85  brain]
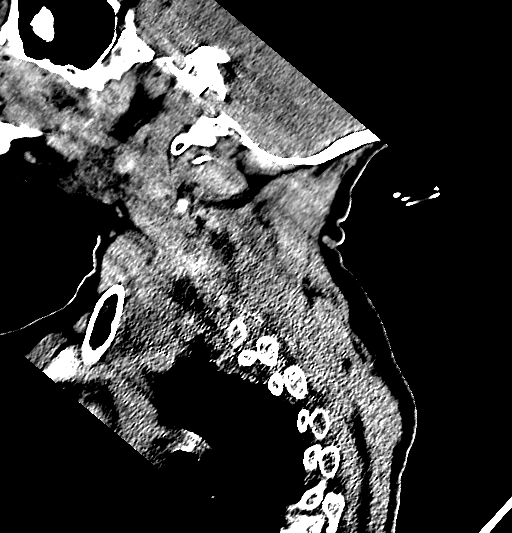
[im 43/85  brain]
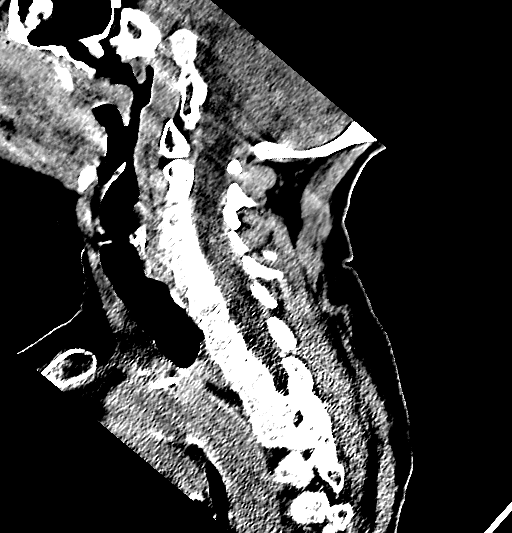
[im 57/85  brain]
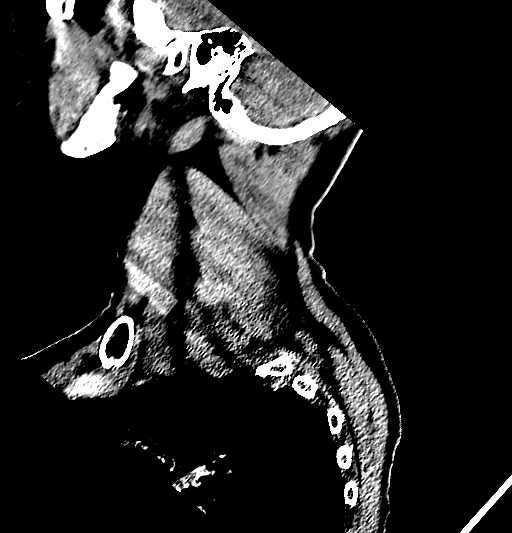

[15 of 37 positions shown; findings below may reference images not displayed]

DIAGNOSTIC STUDIES

EXAM

CT scan of the cervical spine. Without contrast.

INDICATION

degenerative kyphosis, compression fractures
PT STATES HAS NECK PAIN. GAIT INSTABILITY. DIZZINESS. CT/NM 3/0. TJ

TECHNIQUE

All CT scans at this facility use dose modulation, iterative reconstruction, and/or weight based
dosing when appropriate to reduce radiation dose to as low as reasonably achievable.

Number of previous computed tomography exams in the last 12 months is 5.

Number of previous nuclear medicine myocardial perfusion studies in the last 12 months is 0  .

COMPARISONS

December 03, 2017

FINDINGS

Degenerative changes at C1-C2 are re-demonstrated. There is a compression deformity superiorly at
C5 which was not seen on prior exam with associated sclerosis.

Chronic minimal compression deformities superiorly at C6 and C7 are unchanged. Compression
deformity at T2 minimally at T1 are also stable. There is no evidence for subluxation. Fibrotic
changes within the lung apices are partially imaged. Opacification of the left sphenoid air cell is
noted.

IMPRESSION

Compression forming at C5 which was not seen in 0964. Associated sclerosis likely indicates that
this is subacute/chronic. Correlation to evaluate for recent trauma is recommended.

Chronic compression deformities at C6, C7, T1, and T2.

Tech Notes:

PT STATES HAS NECK PAIN. GAIT INSTABILITY. DIZZINESS. CT/NM 3/0. TJ

## 2022-12-10 ENCOUNTER — Encounter: Admit: 2022-12-10 | Discharge: 2022-12-10 | Payer: MEDICARE

## 2022-12-11 NOTE — Progress Notes
Having increase SOB, new heart murmur elevated troponin, tachycardia.      Trop 0.292 warfarin inr 1.8 w bnp 169 cbc normal      12 lead - t wave inversion chronic no ST elevation occasional dropped beat. No ischemia      Loud left sternal boarder holosystolic murmer.      VS p 101 sat 94% on ra r 26 b/p 138/97 on RA      PMH COPD ECHO 2021 - calcification of aortic and mitral prolapse   No pressors or drips stable on RA, no other ICU criteria except loud new murmur.      No cardiology at sending

## 2022-12-12 ENCOUNTER — Encounter: Admit: 2022-12-12 | Discharge: 2022-12-12 | Payer: MEDICARE

## 2024-01-11 DEATH — deceased
